# Patient Record
Sex: Male | Born: 2000 | Race: White | Hispanic: No | Marital: Single | Smoking: Current every day smoker
Health system: Southern US, Community
[De-identification: ages and names within clinical notes are randomized; demographics above are authoritative.]

## PROBLEM LIST (undated history)

## (undated) DIAGNOSIS — R011 Cardiac murmur, unspecified: Secondary | ICD-10-CM

---
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
## 2021-11-18 ENCOUNTER — Emergency Department (HOSPITAL_COMMUNITY)
Admission: EM | Admit: 2021-11-18 | Discharge: 2021-11-19 | Disposition: A | Discharge status: Home / Self Care | Payer: Medicaid Other | Attending: Emergency Medicine | Admitting: Emergency Medicine

## 2021-11-18 ENCOUNTER — Emergency Department (HOSPITAL_COMMUNITY): Payer: Medicaid Other

## 2021-11-18 ENCOUNTER — Other Ambulatory Visit: Payer: Self-pay

## 2021-11-18 DIAGNOSIS — W540XXA Bitten by dog, initial encounter: Secondary | ICD-10-CM | POA: Diagnosis not present

## 2021-11-18 DIAGNOSIS — Z23 Encounter for immunization: Secondary | ICD-10-CM | POA: Diagnosis not present

## 2021-11-18 DIAGNOSIS — S50812A Abrasion of left forearm, initial encounter: Secondary | ICD-10-CM | POA: Insufficient documentation

## 2021-11-18 DIAGNOSIS — S6992XA Unspecified injury of left wrist, hand and finger(s), initial encounter: Secondary | ICD-10-CM | POA: Diagnosis present

## 2021-11-18 NOTE — ED Notes (Signed)
Pt seen walking out of lobby.  ?Believed to have left.  ?

## 2021-11-18 NOTE — ED Provider Triage Note (Signed)
Emergency Medicine Provider Triage Evaluation Note ? ?Alec Lowe , a 21 y.o. male  was evaluated in triage.  Pt complains of dog bite that occurred about 1 hour ago.  He was bit on his left forearm.  Also has abrasions in his right groin from the attack earlier.  He is unsure if the dog is up-to-date on his vaccinations.  States he knows the Doctor, general practice.  Discussed with him the importance of finding out the vaccination status.  Patient states he will call and figure this out. ? ?Review of Systems  ?Positive: As above ?Negative: As above ? ?Physical Exam  ?BP 137/70   Pulse 75   Temp 97.8 ?F (36.6 ?C) (Oral)   Resp 18   SpO2 100%  ?Gen:   Awake, no distress   ?Resp:  Normal effort  ?MSK:   Moves extremities without difficulty  ?Other:  Puncture wounds noted to left forearm.  Significant tenderness to palpation of the left forearm. ? ?Medical Decision Making  ?Medically screening exam initiated at 8:45 PM.  Appropriate orders placed.  Amir Vonbergen was informed that the remainder of the evaluation will be completed by another provider, this initial triage assessment does not replace that evaluation, and the importance of remaining in the ED until their evaluation is complete. ? ? ?  ?Evlyn Courier, PA-C ?11/18/21 2047 ? ?

## 2021-11-18 NOTE — ED Triage Notes (Signed)
Pt presents with dog bite to left wrist.  Pt states this is a dog from the neighborhood, a house dog, but unsure of vaccination status.  Unknown last tetanus as well.  Bleeding controlled.  ?

## 2021-11-19 MED ORDER — TETANUS-DIPHTH-ACELL PERTUSSIS 5-2.5-18.5 LF-MCG/0.5 IM SUSY
0.5000 mL | PREFILLED_SYRINGE | Freq: Once | INTRAMUSCULAR | Status: AC
  Administered 2021-11-19: 0.5 mL via INTRAMUSCULAR
  Filled 2021-11-19: qty 0.5

## 2021-11-19 MED ORDER — AMOXICILLIN-POT CLAVULANATE 875-125 MG PO TABS: 1.0000 | ORAL_TABLET | Freq: Two times a day (BID) | ORAL | 0 refills | Status: AC

## 2021-11-19 MED ORDER — IBUPROFEN 600 MG PO TABS: 600.0000 mg | ORAL_TABLET | Freq: Four times a day (QID) | ORAL | 0 refills | Status: AC | PRN

## 2021-11-19 MED ORDER — IBUPROFEN 400 MG PO TABS
600.0000 mg | ORAL_TABLET | Freq: Once | ORAL | Status: AC
  Administered 2021-11-19: 600 mg via ORAL
  Filled 2021-11-19: qty 1

## 2021-11-19 MED ORDER — AMOXICILLIN-POT CLAVULANATE 875-125 MG PO TABS
1.0000 | ORAL_TABLET | Freq: Once | ORAL | Status: AC
  Administered 2021-11-19: 1 via ORAL
  Filled 2021-11-19: qty 1

## 2021-11-19 NOTE — ED Provider Notes (Signed)
?[facility] EMERGENCY DEPARTMENT ?Provider Note ? ? ?CSN: [id number] ?Arrival date & time: 11/18/21  2006 ? ?  ? ?History ? ?Chief Complaint  ?Patient presents with  ? Animal Bite  ? ? ?Alec Lowe is a 21 y.o. male. ? ?The history is provided by the patient.  ?Animal Bite ?Alec Lowe is a 21 y.o. male who presents to the Emergency Department complaining of dog bite.  He presents to the emergency department for evaluation of dog bite from his friend's pit bull that occurred just prior to ED arrival.  The dog is kept indoors primarily, is not up-to-date on its vaccines.  The patient states that this was a provoked bite.  Tetanus is unknown.  He is right-hand dominant and has no known medical problems.  He complains of pain to the left wrist. ?  ? ?Home Medications ?Prior to Admission medications   ?Medication Sig Start Date End Date Taking? Authorizing Provider  ?amoxicillin-clavulanate (AUGMENTIN) 875-125 MG tablet Take 1 tablet by mouth every 12 (twelve) hours. 11/19/21  Yes Tilden Fossa, MD  ?ibuprofen (ADVIL) 600 MG tablet Take 1 tablet (600 mg total) by mouth every 6 (six) hours as needed. 11/19/21  Yes Tilden Fossa, MD  ?cephALEXin (KEFLEX) 500 MG capsule Take 1 capsule (500 mg total) by mouth 4 (four) times daily. 03/29/18   Geoffery Lyons, MD  ?   ? ?Allergies    ?Patient has no known allergies.   ? ?Review of Systems   ?Review of Systems  ?All other systems reviewed and are negative. ? ?Physical Exam ?Updated Vital Signs ?BP 128/82   Pulse 82   Temp 98.6 ?F (37 ?C)   Resp 17   SpO2 98%  ?Physical Exam ?Vitals and nursing note reviewed.  ?Constitutional:   ?   Appearance: He is well-developed.  ?HENT:  ?   Head: Normocephalic and atraumatic.  ?Cardiovascular:  ?   Rate and Rhythm: Normal rate and regular rhythm.  ?Pulmonary:  ?   Effort: Pulmonary effort is normal. No respiratory distress.  ?Musculoskeletal:  ?   Comments: 2+ left radial pulse.  There is a wound that is about 1  cm in length, slightly irregular over the dorsal distal forearm.  There is an abrasion and soft tissue swelling over the volar distal forearm.  There is tenderness to palpation throughout the distal forearm.  Flexion extension is intact at the wrist.  5 out of 5 grip strength.  No tenderness to the upper forearm, elbow.  ?Skin: ?   General: Skin is warm and dry.  ?Neurological:  ?   Mental Status: He is alert and oriented to person, place, and time.  ?Psychiatric:     ?   Behavior: Behavior normal.  ? ? ?ED Results / Procedures / Treatments   ?Labs ?(all labs ordered are listed, but only abnormal results are displayed) ?Labs Reviewed - No data to display ? ?EKG ?None ? ?Radiology ?DG Forearm Left ? ?Result Date: 11/18/2021 ?CLINICAL DATA:  Dog bite EXAM: LEFT FOREARM - 2 VIEW COMPARISON:  None Available. FINDINGS: Gas within the distal left forearm soft tissues. No fracture, subluxation or dislocation. No radiopaque foreign body. IMPRESSION: No fracture or radiopaque foreign body. Gas within the distal soft tissues of the left forearm. Electronically Signed   By: Charlett Nose M.D.   On: 11/18/2021 21:17   ? ?Procedures ?Procedures  ? ? ?Medications Ordered in ED ?Medications  ?amoxicillin-clavulanate (AUGMENTIN) 875-125 MG per tablet 1 tablet (has  no administration in time range)  ?ibuprofen (ADVIL) tablet 600 mg (600 mg Oral Given 11/19/21 0341)  ?Tdap (BOOSTRIX) injection 0.5 mL (0.5 mLs Intramuscular Given 11/19/21 0520)  ? ? ?ED Course/ Medical Decision Making/ A&P ?  ?                        ?Medical Decision Making ?Risk ?Prescription drug management. ? ? ?Patient here for evaluation of provoked dog bite.  He has wounds to the left distal forearm.  These do not need repaired.  They were irrigated early on in patient's ED stay.  Tetanus has been updated.  There is soft tissue gas on his x-ray without any evidence of foreign body or fracture.  No evidence of open joint.  He is able to range the joint without  difficulty.  Discussed with patient home care for dog bite.  Also discussed that the dog will need to be monitored as it is not up-to-date on its vaccines.  Discussed return precautions for evidence of infection. ? ? ? ? ? ? ?Final Clinical Impression(s) / ED Diagnoses ?Final diagnoses:  ?Dog bite, initial encounter  ? ? ?Rx / DC Orders ?ED Discharge Orders   ? ?      Ordered  ?  amoxicillin-clavulanate (AUGMENTIN) 875-125 MG tablet  Every 12 hours       ? 11/19/21 0534  ?  ibuprofen (ADVIL) 600 MG tablet  Every 6 hours PRN       ? 11/19/21 0534  ? ?  ?  ? ?  ? ? ?  ?Tilden Fossa, MD ?11/19/21 [phone] ? ?

## 2021-11-19 NOTE — ED Provider Triage Note (Addendum)
Emergency Medicine Provider Triage Evaluation Note ? ?The patient has been here for over 7.5 hours and is complaining of pain. Nursing staff thoroughly irrigated the wounds and applied bacitracin ointment to the affected arm. The patient has intact finger-thumb opposition, flexion and extension of the wrist with pain. Radial pulses intact. Cap refill intact. Concern for crepitus in the arm. Discussed with attending. Ordered 600mg  ibuprofen for pain. Explained to the patient the need for rabies vaccination given the unknown status of the dog. The patient does not want to rabies vaccines. I discussed the risks of not getting the vaccination and the complications of rabies. The patient verbalizes understanding and remains adamant that he does not want to receive the Rabies vaccine. He is amenable to the tetanus vaccine.  ?  ? ? ? ?  ?Sherrell Puller, PA-C ?11/19/21 [id number] ? ?  ?Sherrell Puller, PA-C ?11/19/21 0410 ? ?

## 2021-11-19 NOTE — ED Notes (Signed)
Pt step outside to car ?

## 2022-06-07 ENCOUNTER — Encounter (HOSPITAL_COMMUNITY): Payer: Self-pay

## 2022-06-07 ENCOUNTER — Other Ambulatory Visit: Payer: Self-pay

## 2022-06-07 ENCOUNTER — Emergency Department (HOSPITAL_COMMUNITY)
Admission: EM | Admit: 2022-06-07 | Discharge: 2022-06-07 | Disposition: A | Discharge status: Home / Self Care | Payer: Medicaid Other | Attending: Emergency Medicine | Admitting: Emergency Medicine

## 2022-06-07 ENCOUNTER — Emergency Department (HOSPITAL_COMMUNITY): Payer: Medicaid Other

## 2022-06-07 DIAGNOSIS — S61411A Laceration without foreign body of right hand, initial encounter: Secondary | ICD-10-CM | POA: Insufficient documentation

## 2022-06-07 DIAGNOSIS — S6991XA Unspecified injury of right wrist, hand and finger(s), initial encounter: Secondary | ICD-10-CM | POA: Diagnosis present

## 2022-06-07 DIAGNOSIS — W25XXXA Contact with sharp glass, initial encounter: Secondary | ICD-10-CM | POA: Insufficient documentation

## 2022-06-07 DIAGNOSIS — Y92810 Car as the place of occurrence of the external cause: Secondary | ICD-10-CM | POA: Insufficient documentation

## 2022-06-07 HISTORY — DX: Cardiac murmur, unspecified: R01.1

## 2022-06-07 MED ORDER — IBUPROFEN 400 MG PO TABS
600.0000 mg | ORAL_TABLET | Freq: Once | ORAL | Status: AC
  Administered 2022-06-07: 600 mg via ORAL
  Filled 2022-06-07: qty 1

## 2022-06-07 MED ORDER — LIDOCAINE HCL (PF) 1 % IJ SOLN
5.0000 mL | Freq: Once | INTRAMUSCULAR | Status: AC
  Administered 2022-06-07: 5 mL via INTRADERMAL
  Filled 2022-06-07: qty 5

## 2022-06-07 MED ORDER — LIDOCAINE-EPINEPHRINE-TETRACAINE (LET) TOPICAL GEL
3.0000 mL | Freq: Once | TOPICAL | Status: AC
  Administered 2022-06-07: 3 mL via TOPICAL
  Filled 2022-06-07: qty 3

## 2022-06-07 NOTE — ED Provider Triage Note (Signed)
Emergency Medicine Provider Triage Evaluation Note  Alec Lowe , a 21 y.o. male  was evaluated in triage.  Pt complains of multiple lacerations to right hand after punching rear view mirror in his car.  Right hand dominant.  Tetanus UTD.  Review of Systems  Positive: Lacerations, hand pain Negative: fever  Physical Exam  BP (!) 141/73   Pulse 60   Temp 98.5 F (36.9 C)   Resp 16   Ht 5\' 10"  (1.778 m)   Wt 63.5 kg   SpO2 100%   BMI 20.09 kg/m  Gen:   Awake, no distress   Resp:  Normal effort  MSK:   Moves extremities without difficulty  Other:  Irregular lacerations noted over right 3rd and 5th MCP joints, some oozing of blood noted but no pulsatile bleeding, radial pulse intact, able to move all fingers  Medical Decision Making  Medically screening exam initiated at 1:24 AM.  Appropriate orders placed.  Alec Lowe was informed that the remainder of the evaluation will be completed by another provider, this initial triage assessment does not replace that evaluation, and the importance of remaining in the ED until their evaluation is complete.  Hand pain, laceration.  Punched Alec Lowe.  Tetanus UTD.  Irregular lacerations noted on exam.  Wound bandaged.  X-ray ordered.  Will need lac repair.   Manufacturing systems engineer, PA-C 06/07/22 [phone]

## 2022-06-07 NOTE — ED Provider Notes (Signed)
[facility] EMERGENCY DEPARTMENT Provider Note   CSN: [id number] Arrival date & time: 06/07/22  0041     History  Chief Complaint  Patient presents with   Extremity Laceration    Alec Lowe is a 21 y.o. male.  HPI Patient presents for right hand injury.  He has no chronic medical conditions.  Last night at approximately 1 PM, patient punched a car side mirror.  He sustained lacerations to the PCP areas of right hand.  Bleeding is since been controlled.  Patient denies any other injuries.  He did have his tetanus updated 4 months ago after dog bite.   Home Medications Prior to Admission medications   Medication Sig Start Date End Date Taking? Authorizing Provider  amoxicillin-clavulanate (AUGMENTIN) 875-125 MG tablet Take 1 tablet by mouth every 12 (twelve) hours. 11/19/21   Tilden Fossa, MD  cephALEXin (KEFLEX) 500 MG capsule Take 1 capsule (500 mg total) by mouth 4 (four) times daily. 03/29/18   Geoffery Lyons, MD  ibuprofen (ADVIL) 600 MG tablet Take 1 tablet (600 mg total) by mouth every 6 (six) hours as needed. 11/19/21   Tilden Fossa, MD      Allergies    Patient has no known allergies.    Review of Systems   Review of Systems  Skin:  Positive for wound.  All other systems reviewed and are negative.   Physical Exam Updated Vital Signs BP 121/81 (BP Location: Left Arm)   Pulse 69   Temp 97.6 F (36.4 C)   Resp 17   Ht 5\' 10"  (1.778 m)   Wt 63.5 kg   SpO2 100%   BMI 20.09 kg/m  Physical Exam Vitals and nursing note reviewed.  Constitutional:      General: He is not in acute distress.    Appearance: Normal appearance. He is well-developed and normal weight. He is not ill-appearing, toxic-appearing or diaphoretic.  HENT:     Head: Normocephalic and atraumatic.     Right Ear: External ear normal.     Left Ear: External ear normal.     Nose: Nose normal.     Mouth/Throat:     Mouth: Mucous membranes are moist.     Pharynx:  Oropharynx is clear.  Eyes:     Extraocular Movements: Extraocular movements intact.     Conjunctiva/sclera: Conjunctivae normal.  Cardiovascular:     Rate and Rhythm: Normal rate and regular rhythm.     Heart sounds: No murmur heard. Pulmonary:     Effort: Pulmonary effort is normal. No respiratory distress.  Abdominal:     General: There is no distension.     Palpations: Abdomen is soft.  Musculoskeletal:        General: Signs of injury present. No swelling. Normal range of motion.     Cervical back: Normal range of motion and neck supple.     Right lower leg: No edema.     Left lower leg: No edema.  Skin:    General: Skin is warm and dry.     Coloration: Skin is not jaundiced or pale.  Neurological:     General: No focal deficit present.     Mental Status: He is alert and oriented to person, place, and time.     Cranial Nerves: No cranial nerve deficit.     Sensory: No sensory deficit.     Motor: No weakness.     Coordination: Coordination normal.  Psychiatric:  Mood and Affect: Mood normal.        Behavior: Behavior normal.        Thought Content: Thought content normal.        Judgment: Judgment normal.     ED Results / Procedures / Treatments   Labs (all labs ordered are listed, but only abnormal results are displayed) Labs Reviewed - No data to display  EKG None  Radiology DG Hand Complete Right  Result Date: 06/07/2022 CLINICAL DATA:  Punched air. EXAM: RIGHT HAND - COMPLETE 3+ VIEW COMPARISON:  None Available. FINDINGS: There is no evidence of fracture or dislocation. There is no evidence of arthropathy or other focal bone abnormality. A soft tissue defect is present over the fifth metacarpal phalangeal joint. No radiopaque foreign body. IMPRESSION: No acute fracture or radiopaque foreign body. Electronically Signed   By: Thornell Sartorius M.D.   On: 06/07/2022 01:37    Procedures .Marland KitchenLaceration Repair  Date/Time: 06/07/2022 9:04 AM  Performed by: Gloris Manchester, MD Authorized by: Gloris Manchester, MD   Consent:    Consent obtained:  Verbal   Consent given by:  Patient   Risks, benefits, and alternatives were discussed: yes     Risks discussed:  Infection, pain and poor cosmetic result   Alternatives discussed:  No treatment Universal protocol:    Procedure explained and questions answered to patient or proxy's satisfaction: yes     Imaging studies available: yes     Patient identity confirmed:  Verbally with patient Anesthesia:    Anesthesia method:  Topical application and local infiltration   Topical anesthetic:  LET   Local anesthetic:  Lidocaine 1% w/o epi Laceration details:    Location:  Hand   Hand location:  R hand, dorsum   Length (cm):  8   Depth (mm):  0.5 Pre-procedure details:    Preparation:  Patient was prepped and draped in usual sterile fashion and imaging obtained to evaluate for foreign bodies Exploration:    Imaging obtained: x-ray     Imaging outcome: foreign body not noted     Wound exploration: wound explored through full range of motion and entire depth of wound visualized     Contaminated: no   Treatment:    Area cleansed with:  Saline   Amount of cleaning:  Standard   Irrigation solution:  Sterile saline   Irrigation volume:  300   Irrigation method:  Syringe   Visualized foreign bodies/material removed: no   Skin repair:    Repair method:  Sutures   Suture size:  5-0   Suture material:  Nylon   Suture technique:  Simple interrupted   Number of sutures:  8 Approximation:    Approximation:  Close Repair type:    Repair type:  Simple Post-procedure details:    Dressing: Xeroform and Kerlix.   Procedure completion:  Tolerated well, no immediate complications     Medications Ordered in ED Medications  ibuprofen (ADVIL) tablet 600 mg (600 mg Oral Given 06/07/22 0753)  lidocaine-EPINEPHrine-tetracaine (LET) topical gel (3 mLs Topical Given 06/07/22 0753)  lidocaine (PF) (XYLOCAINE) 1 % injection 5  mL (5 mLs Intradermal Given by Other 06/07/22 2878)    ED Course/ Medical Decision Making/ A&P                           Medical Decision Making Risk Prescription drug management.   Patient is a healthy 21 year old male presenting for lacerations to  right hand.  This was sustained when he punched a review mirror of a car.  Prior to being bedded in the ED, x-ray of hand was obtained.  No radiopaque foreign bodies are identified.  On assessment, patient is well-appearing.  He has hemostatic lacerations to the third and fifth MCPs.  Range of motion is intact.  Patient was given ibuprofen for analgesia.  Let gel was applied.  Wound was repaired, as per procedure note above.  Patient was discharged in good condition.        Final Clinical Impression(s) / ED Diagnoses Final diagnoses:  Laceration of right hand without foreign body, initial encounter    Rx / DC Orders ED Discharge Orders     None         Gloris Manchester, MD 06/07/22 [phone]

## 2022-06-07 NOTE — Discharge Instructions (Signed)
Sutures will need to be removed in 7 days.  If area wound become increasingly red, swollen, painful, or begins draining pus, please return to the emergency department.

## 2022-06-07 NOTE — ED Triage Notes (Signed)
Pt reports he punched the rearview mirror in his car tonight and has sustained lacerations to right knuckles (middle and fifth). Bleeding controlled and wrapped in triage.

## 2022-06-14 ENCOUNTER — Ambulatory Visit (HOSPITAL_COMMUNITY)
Admission: EM | Admit: 2022-06-14 | Discharge: 2022-06-14 | Disposition: A | Discharge status: Home / Self Care | Payer: Medicaid Other

## 2022-06-14 DIAGNOSIS — Z4802 Encounter for removal of sutures: Secondary | ICD-10-CM | POA: Diagnosis not present

## 2022-06-14 DIAGNOSIS — S61411D Laceration without foreign body of right hand, subsequent encounter: Secondary | ICD-10-CM | POA: Diagnosis not present

## 2022-06-14 NOTE — ED Triage Notes (Signed)
Here for suture removal to right hand. Site appears well healed/approximated. No redness, warmth, drainage. Lamptey MD verified sutures were okay for removal. 8 sutures removed during visit today.

## 2024-01-02 IMAGING — DX DG FOREARM 2V*L*
2 series · 2 of 2 positions shown · non-contrast
Comparison: None Available.

CLINICAL DATA: Dog bite

EXAM:
LEFT FOREARM - 2 VIEW

[forearm ap]
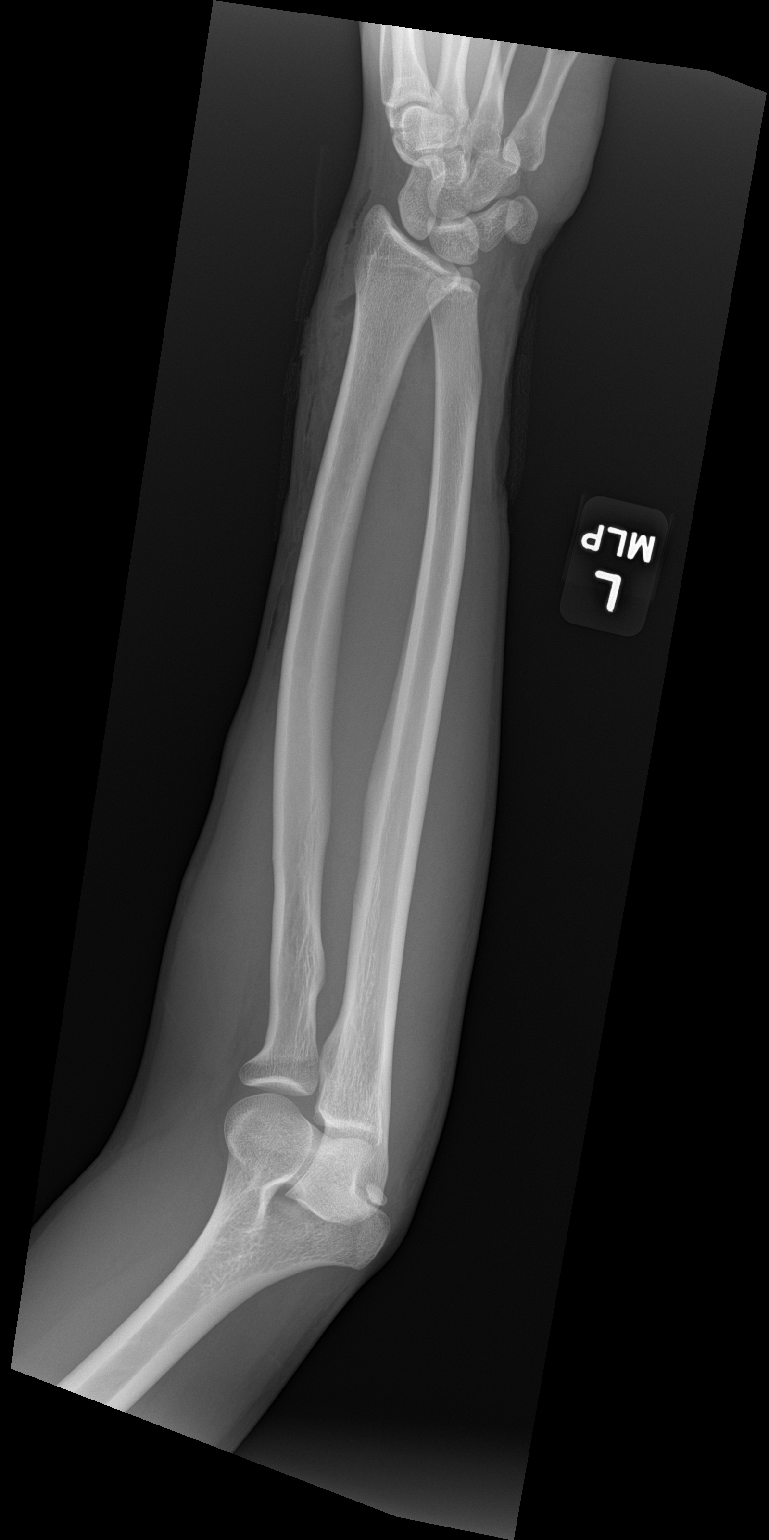

[forearm lat]
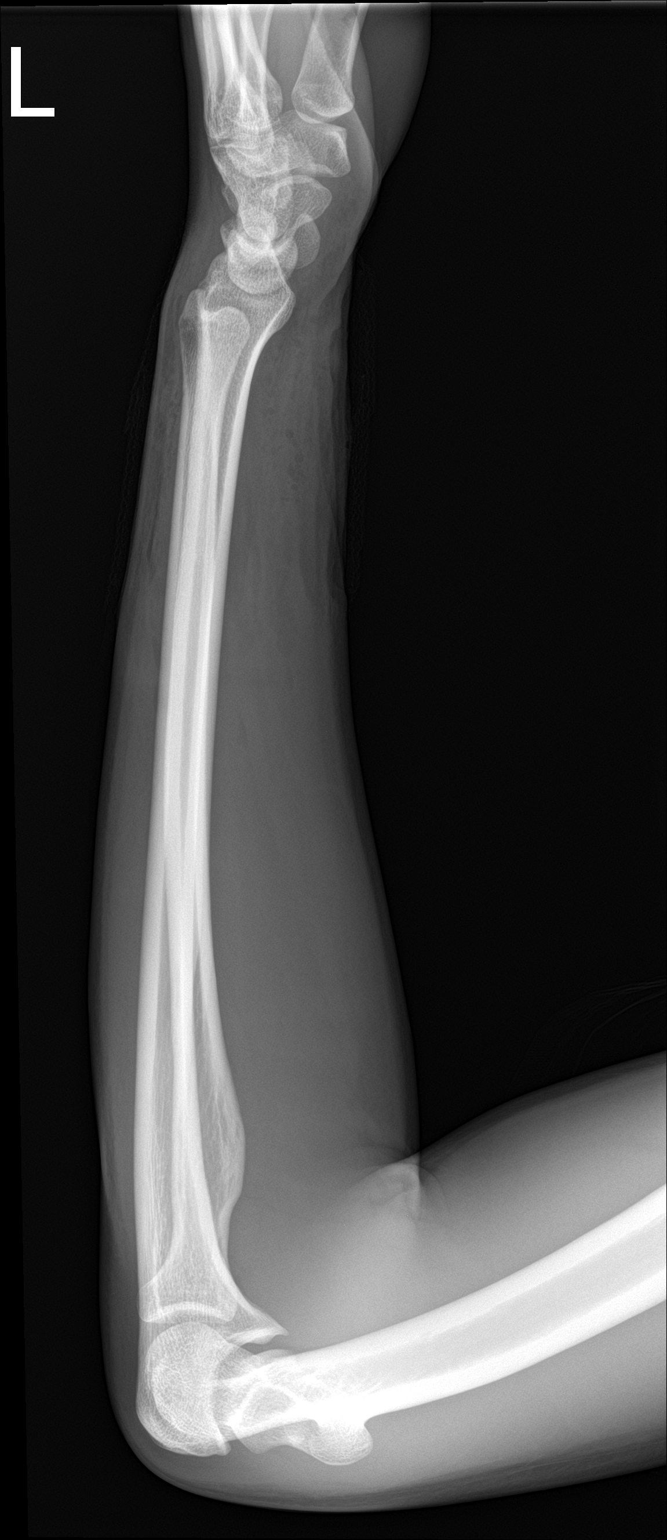

[2 of 2 positions shown; findings below may reference images not displayed]

FINDINGS: Gas within the distal left forearm soft tissues. No fracture,
subluxation or dislocation. No radiopaque foreign body.
IMPRESSION: No fracture or radiopaque foreign body. Gas within the distal soft
tissues of the left forearm.
